# Patient Record
Sex: Male | Born: 1945 | Race: White | Hispanic: No | Marital: Married
Health system: Southern US, Community
[De-identification: ages and names within clinical notes are randomized; demographics above are authoritative.]

---

## 1997-12-25 ENCOUNTER — Ambulatory Visit (HOSPITAL_COMMUNITY): Admission: RE | Admit: 1997-12-25 | Discharge: 1997-12-25 | Payer: Self-pay | Admitting: Gastroenterology

## 2000-06-21 ENCOUNTER — Encounter: Payer: Self-pay | Admitting: Orthopedic Surgery

## 2000-06-21 ENCOUNTER — Encounter: Admission: RE | Admit: 2000-06-21 | Discharge: 2000-06-21 | Payer: Self-pay | Admitting: Orthopedic Surgery

## 2001-02-14 ENCOUNTER — Encounter: Payer: Self-pay | Admitting: Orthopedic Surgery

## 2001-02-14 ENCOUNTER — Encounter: Admission: RE | Admit: 2001-02-14 | Discharge: 2001-02-14 | Payer: Self-pay | Admitting: Orthopedic Surgery

## 2006-05-03 ENCOUNTER — Ambulatory Visit: Payer: Self-pay | Admitting: Gastroenterology

## 2006-05-21 ENCOUNTER — Ambulatory Visit: Payer: Self-pay | Admitting: Gastroenterology

## 2007-04-29 ENCOUNTER — Encounter: Admission: RE | Admit: 2007-04-29 | Discharge: 2007-06-12 | Payer: Self-pay | Admitting: Family Medicine

## 2007-08-26 ENCOUNTER — Encounter: Admission: RE | Admit: 2007-08-26 | Discharge: 2007-08-26 | Payer: Self-pay | Admitting: Family Medicine

## 2008-11-28 IMAGING — CT CT HEAD W/O CM
2 series · 16 of 30 positions shown, 20 images · non-contrast
Comparison: None available

CLINICAL DATA: Dizziness

CT HEAD WITHOUT CONTRAST
TECHNIQUE: Contiguous axial images were obtained from the base of
the skull through the vertex without contrast.

[Series 2: head w/o · axial · non-contrast · 0.47mm/px · z∈[-5,+136]mm · 13 of 32 slices shown, 17 images]
[im 3/32  brain]
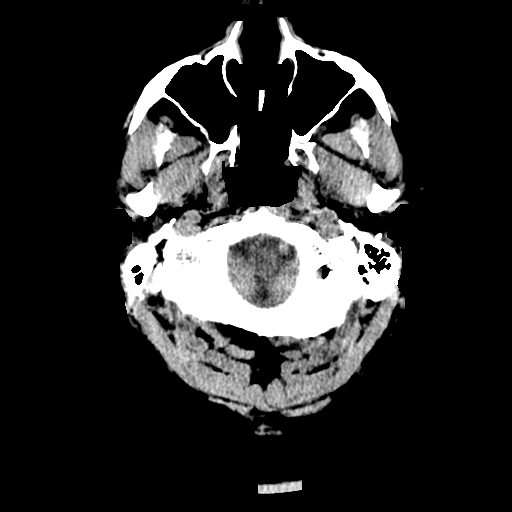
[im 3/32  bone]
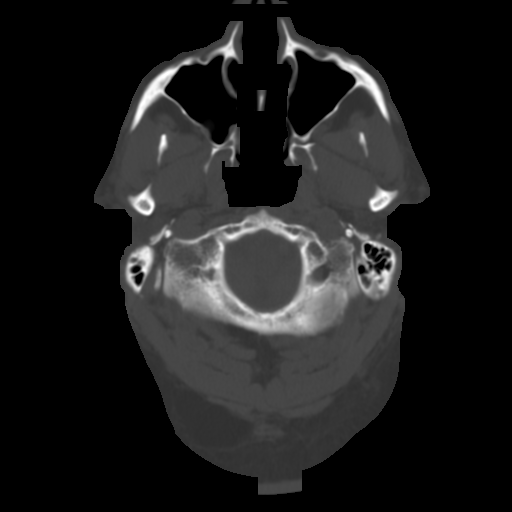
[im 5/32  brain]
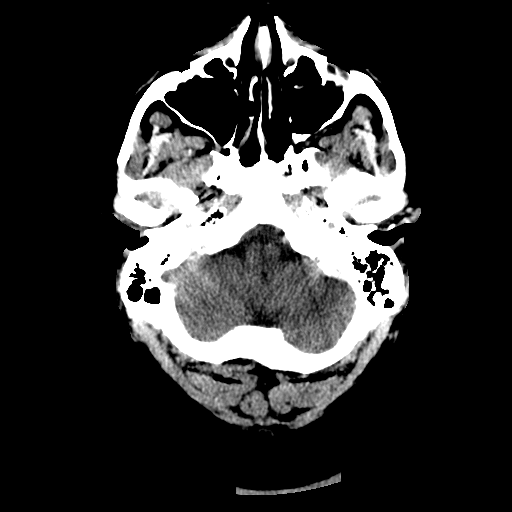
[im 7/32  brain]
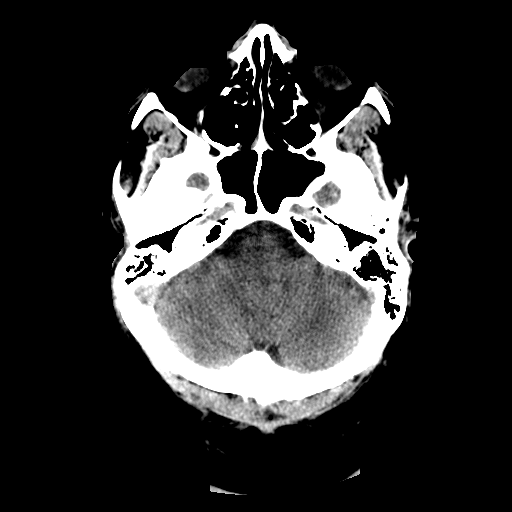
[im 9/32  brain]
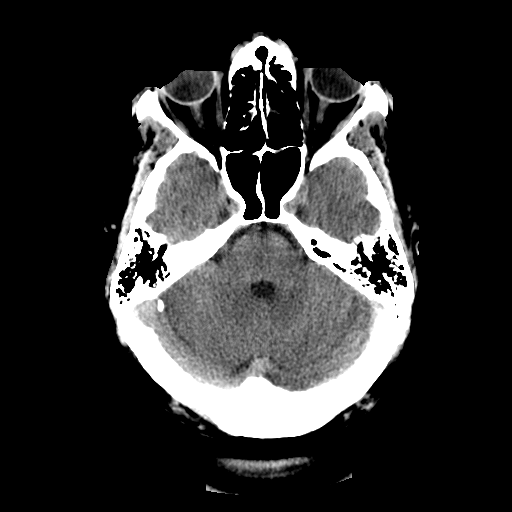
[im 12/32  brain]
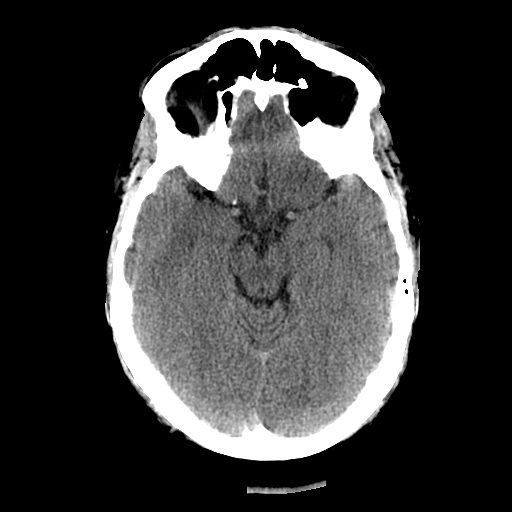
[im 12/32  bone]
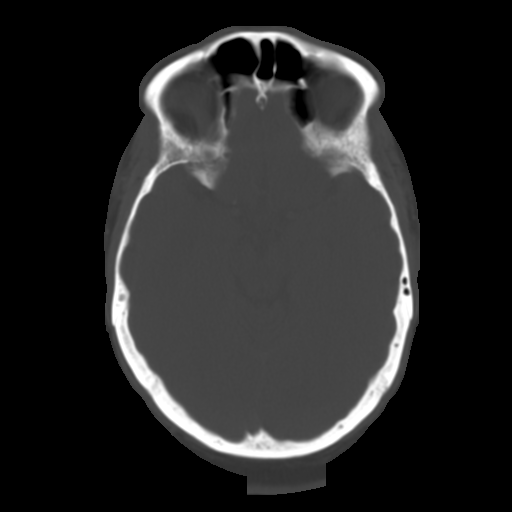
[im 14/32  brain]
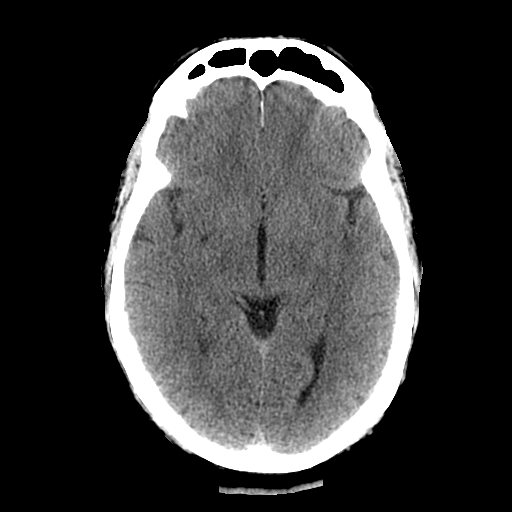
[im 16/32  brain]
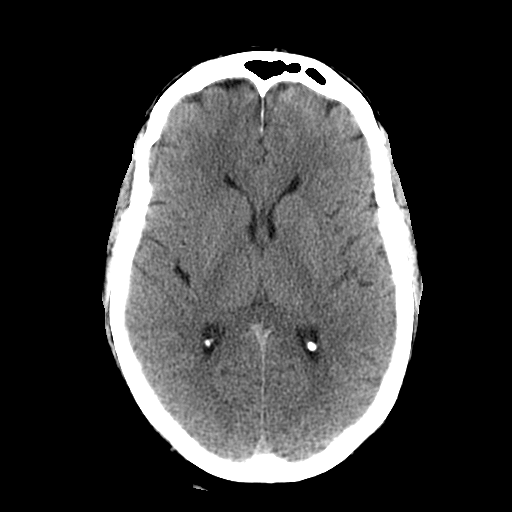
[im 18/32  brain]
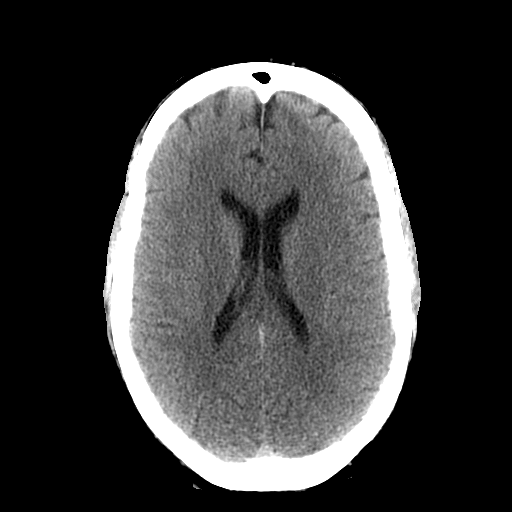
[im 20/32  brain]
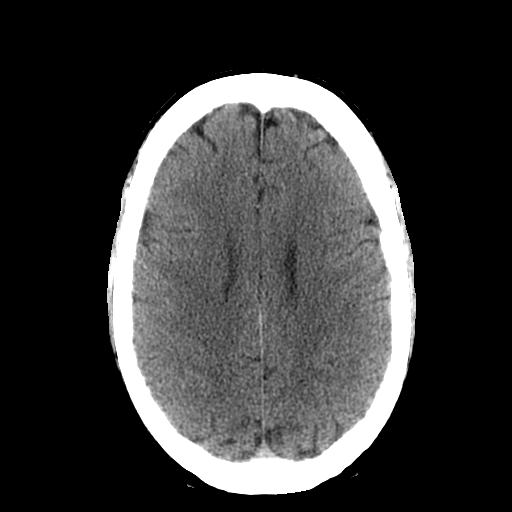
[im 20/32  bone]
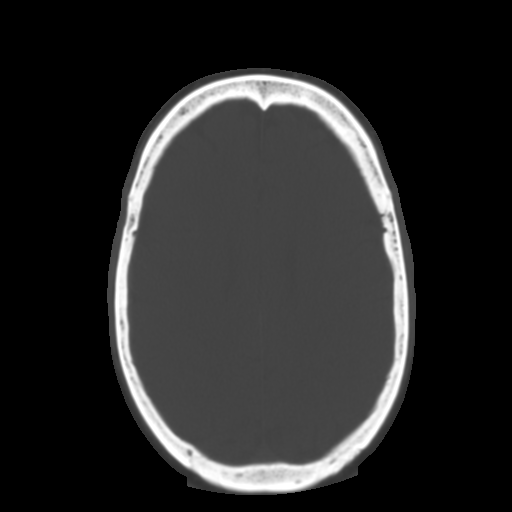
[im 23/32  brain]
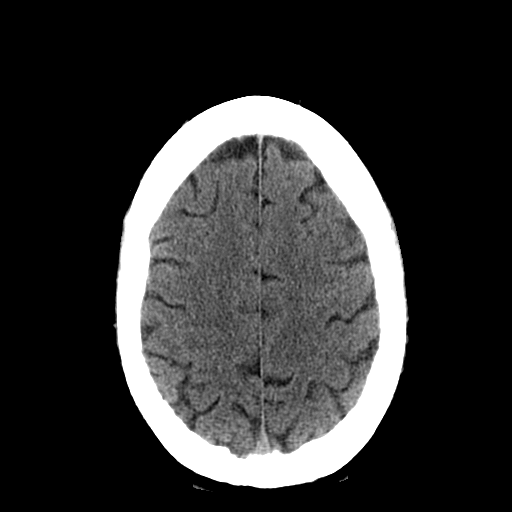
[im 25/32  brain]
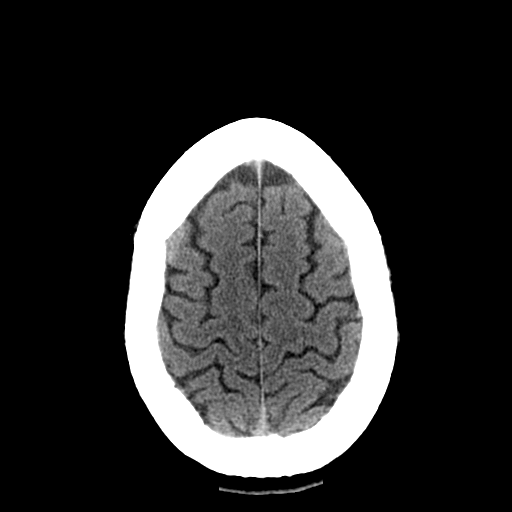
[im 27/32  brain]
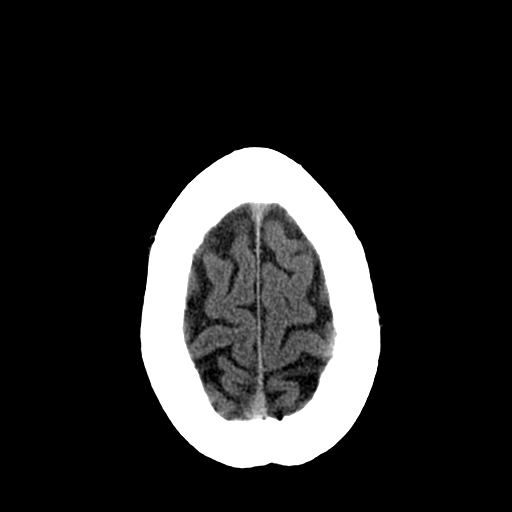
[im 29/32  brain]
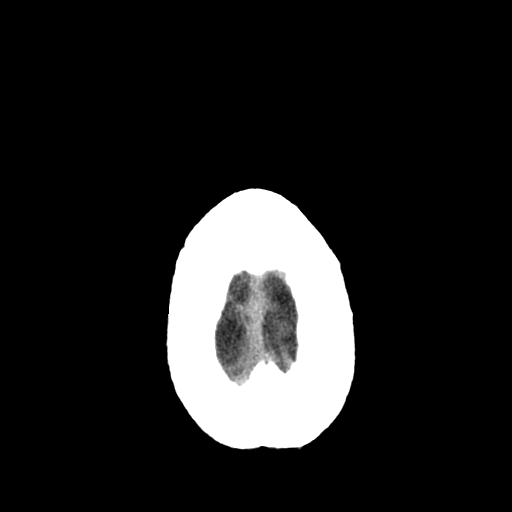
[im 29/32  bone]
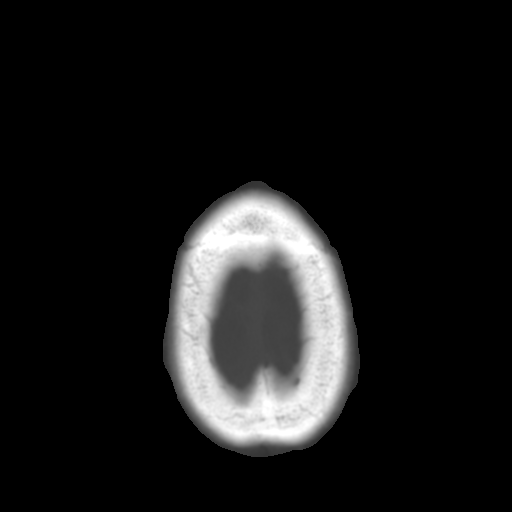

[Series 3: bone windows · axial · 0.47mm/px · z∈[-5,+44]mm · 3 of 32 slices shown]
[im 3/32  bone]
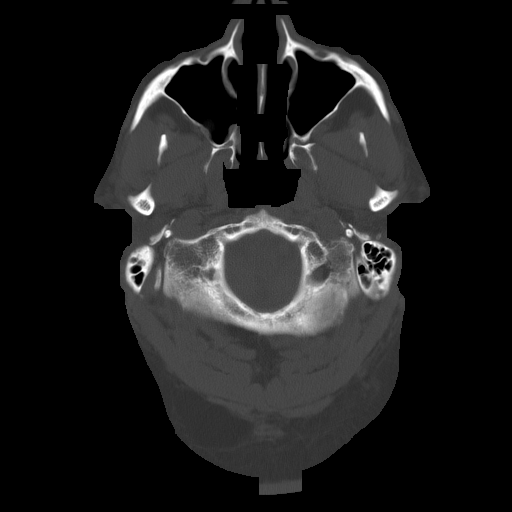
[im 7/32  bone]
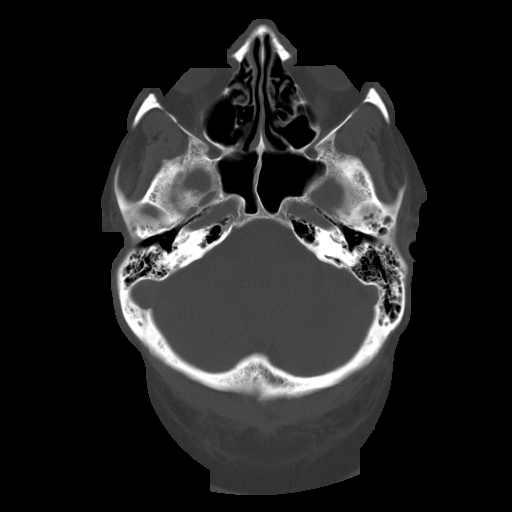
[im 12/32  bone]
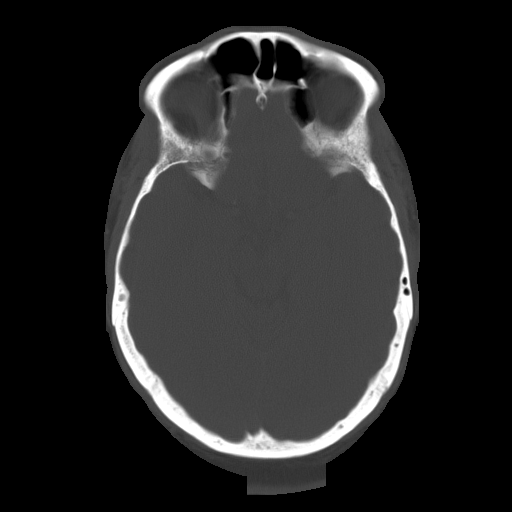

[16 of 30 positions shown; findings below may reference images not displayed]

FINDINGS: Retention cysts or polyps in the maxillary sinuses,
incompletely imaged. There is no evidence of acute intracranial
hemorrhage, brain edema, mass,  mass effect, or midline shift.
Acute infarct may be inapparent on noncontrast CT.  No other intra-
axial abnormalities are seen, and the ventricles and sulci are
within normal limits in size and symmetry.   No abnormal extra-
axial fluid collections or masses are identified.  No significant
calvarial abnormality.
IMPRESSION: 1.  Negative for bleed or other acute intracranial process.
2.  Maxillary sinus disease

## 2011-03-29 ENCOUNTER — Encounter: Payer: Self-pay | Admitting: Gastroenterology

## 2012-02-26 ENCOUNTER — Encounter: Payer: Self-pay | Admitting: Gastroenterology

## 2019-02-21 ENCOUNTER — Ambulatory Visit: Payer: Medicare HMO | Attending: Internal Medicine

## 2019-02-21 DIAGNOSIS — Z23 Encounter for immunization: Secondary | ICD-10-CM | POA: Insufficient documentation

## 2019-02-21 NOTE — Progress Notes (Signed)
   Covid-[facility] Vaccination [facility]  Name:  LANGSTON TUBERVILLE    MRN: [id number] DOB: [birth year]  02/21/2019  Mr. Bordon was observed post Covid-19 immunization for 15 minutes without incidence. He was provided with Vaccine Information Sheet and instruction to access the V-Safe system.   Mr. Petroni was instructed to call 911 with any severe reactions post vaccine: Marland Kitchen Difficulty breathing  . Swelling of your face and throat  . A fast heartbeat  . A bad rash all over your body  . Dizziness and weakness    Immunizations Administered    Name Date Dose VIS Date Route   Pfizer COVID-19 Vaccine 02/21/2019 10:50 AM 0.3 mL 01/10/2019 Intramuscular   Manufacturer: [employer], [employer]   Lot: [id number]   NDC: [address]-3

## 2019-03-14 ENCOUNTER — Ambulatory Visit: Payer: Medicare HMO | Attending: Internal Medicine

## 2019-03-14 DIAGNOSIS — Z23 Encounter for immunization: Secondary | ICD-10-CM | POA: Insufficient documentation

## 2019-03-14 NOTE — Progress Notes (Signed)
   Covid-19 Vaccination Clinic  Name:  DEMARRIO MENGES    MRN: [id number] DOB: November 28, 1945  03/14/2019  Mr. Merolla was observed post Covid-19 immunization for 15 minutes without incidence. He was provided with Vaccine Information Sheet and instruction to access the V-Safe system.   Mr. Alyea was instructed to call 911 with any severe reactions post vaccine: Marland Kitchen Difficulty breathing  . Swelling of your face and throat  . A fast heartbeat  . A bad rash all over your body  . Dizziness and weakness    Immunizations Administered    Name Date Dose VIS Date Route   Pfizer COVID-19 Vaccine 03/14/2019  2:14 PM 0.3 mL 01/10/2019 Intramuscular   Manufacturer: [employer], [employer]   Lot: [id number]   NDC: [address]-5
